# Patient Record
Sex: Female | Born: 2000 | Race: White | Hispanic: No | Marital: Single | Smoking: Never smoker
Health system: Southern US, Community
[De-identification: ages and names within clinical notes are randomized; demographics above are authoritative.]

## PROBLEM LIST (undated history)

## (undated) DIAGNOSIS — T7840XA Allergy, unspecified, initial encounter: Secondary | ICD-10-CM

## (undated) DIAGNOSIS — F509 Eating disorder, unspecified: Secondary | ICD-10-CM

## (undated) HISTORY — DX: Allergy, unspecified, initial encounter: T78.40XA

## (undated) HISTORY — PX: APPENDECTOMY: SHX54

---
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
## 2022-05-19 DIAGNOSIS — J039 Acute tonsillitis, unspecified: Secondary | ICD-10-CM | POA: Diagnosis not present

## 2022-05-19 DIAGNOSIS — R509 Fever, unspecified: Secondary | ICD-10-CM | POA: Diagnosis not present

## 2022-05-23 DIAGNOSIS — L049 Acute lymphadenitis, unspecified: Secondary | ICD-10-CM | POA: Diagnosis not present

## 2022-05-23 DIAGNOSIS — J039 Acute tonsillitis, unspecified: Secondary | ICD-10-CM | POA: Diagnosis not present

## 2022-08-01 DIAGNOSIS — Z3009 Encounter for other general counseling and advice on contraception: Secondary | ICD-10-CM | POA: Diagnosis not present

## 2022-08-01 DIAGNOSIS — N926 Irregular menstruation, unspecified: Secondary | ICD-10-CM | POA: Diagnosis not present

## 2022-08-09 DIAGNOSIS — J039 Acute tonsillitis, unspecified: Secondary | ICD-10-CM | POA: Diagnosis not present

## 2022-08-09 DIAGNOSIS — J029 Acute pharyngitis, unspecified: Secondary | ICD-10-CM | POA: Diagnosis not present

## 2023-01-15 DIAGNOSIS — F5002 Anorexia nervosa, binge eating/purging type: Secondary | ICD-10-CM | POA: Diagnosis not present

## 2023-01-22 DIAGNOSIS — F5002 Anorexia nervosa, binge eating/purging type: Secondary | ICD-10-CM | POA: Diagnosis not present

## 2023-01-29 DIAGNOSIS — F5002 Anorexia nervosa, binge eating/purging type: Secondary | ICD-10-CM | POA: Diagnosis not present

## 2023-02-06 DIAGNOSIS — F5002 Anorexia nervosa, binge eating/purging type: Secondary | ICD-10-CM | POA: Diagnosis not present

## 2023-02-13 DIAGNOSIS — F5002 Anorexia nervosa, binge eating/purging type: Secondary | ICD-10-CM | POA: Diagnosis not present

## 2023-02-20 DIAGNOSIS — Z713 Dietary counseling and surveillance: Secondary | ICD-10-CM | POA: Diagnosis not present

## 2023-02-20 DIAGNOSIS — F5002 Anorexia nervosa, binge eating/purging type: Secondary | ICD-10-CM | POA: Diagnosis not present

## 2023-02-26 DIAGNOSIS — Z713 Dietary counseling and surveillance: Secondary | ICD-10-CM | POA: Diagnosis not present

## 2023-02-26 DIAGNOSIS — F5002 Anorexia nervosa, binge eating/purging type: Secondary | ICD-10-CM | POA: Diagnosis not present

## 2023-03-26 DIAGNOSIS — Z713 Dietary counseling and surveillance: Secondary | ICD-10-CM | POA: Diagnosis not present

## 2023-03-29 DIAGNOSIS — F5002 Anorexia nervosa, binge eating/purging type: Secondary | ICD-10-CM | POA: Diagnosis not present

## 2023-04-09 DIAGNOSIS — Z713 Dietary counseling and surveillance: Secondary | ICD-10-CM | POA: Diagnosis not present

## 2023-04-18 DIAGNOSIS — F509 Eating disorder, unspecified: Secondary | ICD-10-CM | POA: Diagnosis not present

## 2023-09-03 DIAGNOSIS — R233 Spontaneous ecchymoses: Secondary | ICD-10-CM | POA: Diagnosis not present

## 2023-09-03 DIAGNOSIS — H6123 Impacted cerumen, bilateral: Secondary | ICD-10-CM | POA: Diagnosis not present

## 2023-10-07 DIAGNOSIS — T148XXA Other injury of unspecified body region, initial encounter: Secondary | ICD-10-CM | POA: Diagnosis not present

## 2023-10-07 DIAGNOSIS — L299 Pruritus, unspecified: Secondary | ICD-10-CM | POA: Diagnosis not present

## 2024-03-04 DIAGNOSIS — N926 Irregular menstruation, unspecified: Secondary | ICD-10-CM | POA: Diagnosis not present

## 2024-03-04 DIAGNOSIS — N942 Vaginismus: Secondary | ICD-10-CM | POA: Diagnosis not present

## 2024-03-04 DIAGNOSIS — Z118 Encounter for screening for other infectious and parasitic diseases: Secondary | ICD-10-CM | POA: Diagnosis not present

## 2024-03-04 DIAGNOSIS — Z01411 Encounter for gynecological examination (general) (routine) with abnormal findings: Secondary | ICD-10-CM | POA: Diagnosis not present
